# Patient Record
Sex: Male | Born: 2000
Health system: Southern US, Community
[De-identification: ages and names within clinical notes are randomized; demographics above are authoritative.]

## PROBLEM LIST (undated history)

## (undated) DIAGNOSIS — Z789 Other specified health status: Secondary | ICD-10-CM

## (undated) DIAGNOSIS — F988 Other specified behavioral and emotional disorders with onset usually occurring in childhood and adolescence: Secondary | ICD-10-CM

## (undated) HISTORY — PX: APPENDECTOMY: SHX54

---
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
## 2019-03-22 DIAGNOSIS — T2101XA Burn of unspecified degree of chest wall, initial encounter: Secondary | ICD-10-CM | POA: Diagnosis not present

## 2019-03-22 DIAGNOSIS — L089 Local infection of the skin and subcutaneous tissue, unspecified: Secondary | ICD-10-CM | POA: Diagnosis not present

## 2019-03-23 DIAGNOSIS — Z79899 Other long term (current) drug therapy: Secondary | ICD-10-CM | POA: Diagnosis not present

## 2019-03-23 DIAGNOSIS — F909 Attention-deficit hyperactivity disorder, unspecified type: Secondary | ICD-10-CM | POA: Diagnosis not present

## 2019-04-08 ENCOUNTER — Other Ambulatory Visit: Payer: Self-pay

## 2019-04-08 ENCOUNTER — Ambulatory Visit
Admission: EM | Admit: 2019-04-08 | Discharge: 2019-04-08 | Disposition: A | Discharge status: Home / Self Care | Payer: 59 | Attending: Family Medicine | Admitting: Family Medicine

## 2019-04-08 DIAGNOSIS — S0181XA Laceration without foreign body of other part of head, initial encounter: Secondary | ICD-10-CM | POA: Diagnosis not present

## 2019-04-08 DIAGNOSIS — S0591XA Unspecified injury of right eye and orbit, initial encounter: Secondary | ICD-10-CM

## 2019-04-08 MED ORDER — MELOXICAM 15 MG PO TABS: 15.0000 mg | ORAL_TABLET | Freq: Every day | ORAL | 0 refills | Status: DC | PRN

## 2019-04-08 MED ORDER — MUPIROCIN 2 % EX OINT
1.0000 | TOPICAL_OINTMENT | Freq: Two times a day (BID) | CUTANEOUS | 0 refills | Status: AC
Start: 2019-04-08 — End: 2019-04-15

## 2019-04-08 NOTE — Discharge Instructions (Signed)
Sutures out in 5 days.  Medications as prescribed.  Take care  Dr. Lacinda Axon
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
## 2019-04-19 ENCOUNTER — Telehealth: Payer: 59 | Admitting: Family

## 2019-04-19 ENCOUNTER — Encounter (INDEPENDENT_AMBULATORY_CARE_PROVIDER_SITE_OTHER): Payer: Self-pay

## 2019-04-19 ENCOUNTER — Telehealth: Payer: Self-pay | Admitting: *Deleted

## 2019-04-19 ENCOUNTER — Other Ambulatory Visit: Payer: 59

## 2019-04-19 ENCOUNTER — Telehealth: Payer: Self-pay

## 2019-04-19 DIAGNOSIS — R6889 Other general symptoms and signs: Secondary | ICD-10-CM | POA: Diagnosis not present

## 2019-04-19 DIAGNOSIS — R059 Cough, unspecified: Secondary | ICD-10-CM

## 2019-04-19 DIAGNOSIS — Z20822 Contact with and (suspected) exposure to covid-19: Secondary | ICD-10-CM

## 2019-04-19 DIAGNOSIS — R05 Cough: Secondary | ICD-10-CM

## 2019-04-19 MED ORDER — BENZONATATE 100 MG PO CAPS: 100.0000 mg | ORAL_CAPSULE | Freq: Three times a day (TID) | ORAL | 0 refills | Status: DC | PRN

## 2019-04-19 MED ORDER — ALBUTEROL SULFATE HFA 108 (90 BASE) MCG/ACT IN AERS: 2.0000 | INHALATION_SPRAY | RESPIRATORY_TRACT | 2 refills | Status: DC | PRN

## 2019-04-19 NOTE — Progress Notes (Signed)
Greater than 5 minutes, yet less than 10 minutes of time have been spent researching, coordinating, and implementing care for this patient today.  Thank you for the details you included in the comment boxes. Those details are very helpful in determining the best course of treatment for you and help us to provide the best care.  E-Visit for Corona Virus Screening   Your current symptoms could be consistent with the coronavirus.  Call your health care provider or local health department to request and arrange formal testing. Many health care providers can now test patients at their office but not all are.  Please quarantine yourself while awaiting your test results.  [facility] [phone], [facility] [phone], [facility] [phone] or visit [email or web address].ncdhhs.gov/about-covid-19/testing/covid-[email or web address]  and You have been enrolled in MyChart Home Monitoring for COVID-19.  Daily you will receive a questionnaire within the MyChart website. Our COVID-19 response team will be monitoring your responses daily.    COVID-19 is a respiratory illness with symptoms that are similar to the flu. Symptoms are typically mild to moderate, but there have been cases of severe illness and death due to the virus. The following symptoms may appear 2-14 days after exposure: . Fever . Cough . Shortness of breath or difficulty breathing . Chills . Repeated shaking with chills . Muscle pain . Headache . Sore throat . New loss of taste or smell . Fatigue . Congestion or runny nose . Nausea or vomiting . Diarrhea  It is vitally important that if you feel that you have an infection such as this virus or any other virus that you stay home and away from places where you may spread it to others.  You should self-quarantine for 14 days if you have symptoms that could potentially be coronavirus or have been in close contact a with a  person diagnosed with COVID-19 within the last 2 weeks. You should avoid contact with people age 65 and older.   You should wear a mask or cloth face covering over your nose and mouth if you must be around other people or animals, including pets (even at home). Try to stay at least 6 feet away from other people. This will protect the people around you.  You can use medication such as A prescription cough medication called Tessalon Perles 100 mg. You may take 1-2 capsules every 8 hours as needed for cough and A prescription inhaler called Albuterol MDI 90 mcg /actuation 2 puffs every 4 hours as needed for shortness of breath, wheezing, cough  You may also take acetaminophen (Tylenol) as needed for fever.   Reduce your risk of any infection by using the same precautions used for avoiding the common cold or flu:  . Wash your hands often with soap and warm water for at least 20 seconds.  If soap and water are not readily available, use an alcohol-based hand sanitizer with at least 60% alcohol.  . If coughing or sneezing, cover your mouth and nose by coughing or sneezing into the elbow areas of your shirt or coat, into a tissue or into your sleeve (not your hands). . Avoid shaking hands with others and consider head nods or verbal greetings only. . Avoid touching your eyes, nose, or mouth with unwashed hands.  . Avoid close contact with people who are sick. . Avoid places or events with large numbers of people in one location, like concerts or sporting events. . Carefully consider travel plans you have or   are making. . If you are planning any travel outside or inside the [place], visit the CDC's Travelers' Health webpage for the latest health notices. . If you have some symptoms but not all symptoms, continue to monitor at home and seek medical attention if your symptoms worsen. . If you are having a medical emergency, call 911.  HOME CARE . Only take medications as instructed by your medical  team. . Drink plenty of fluids and get plenty of rest. . A steam or ultrasonic humidifier can help if you have congestion.   GET HELP RIGHT AWAY IF YOU HAVE EMERGENCY WARNING SIGNS** FOR COVID-19. If you or someone is showing any of these signs seek emergency medical care immediately. Call 911 or proceed to your closest emergency facility if: . You develop worsening high fever. . Trouble breathing . Bluish lips or face . Persistent pain or pressure in the chest . New confusion . Inability to wake or stay awake . You cough up blood. . Your symptoms become more severe  **This list is not all possible symptoms. Contact your medical provider for any symptoms that are sever or concerning to you.   MAKE SURE YOU   Understand these instructions.  Will watch your condition.  Will get help right away if you are not doing well or get worse.  Your e-visit answers were reviewed by a board certified advanced clinical practitioner to complete your personal care plan.  Depending on the condition, your plan could have included both over the counter or prescription medications.  If there is a problem please reply once you have received a response from your provider.  Your safety is important to us.  If you have drug allergies check your prescription carefully.    You can use MyChart to ask questions about today's visit, request a non-urgent call back, or ask for a work or school excuse for 24 hours related to this e-Visit. If it has been greater than 24 hours you will need to follow up with your provider, or enter a new e-Visit to address those concerns. You will get an e-mail in the next two days asking about your experience.  I hope that your e-visit has been valuable and will speed your recovery. Thank you for using e-visits.    

## 2019-04-19 NOTE — Telephone Encounter (Signed)
[facility] Calling to request testing for patient Phone: [phone] Fax: [phone]  Exposure at work  Ebony [phone] (mother) She is going to have patient call back to schedule his appointment- orders have been placed.

## 2019-04-19 NOTE — Telephone Encounter (Signed)
Pt returned call for ordered testing by [facility]. Appointment scheduled. Order placed

## 2019-04-21 LAB — NOVEL CORONAVIRUS, NAA: SARS-CoV-2, NAA: NOT DETECTED

## 2019-04-23 ENCOUNTER — Encounter (INDEPENDENT_AMBULATORY_CARE_PROVIDER_SITE_OTHER): Payer: Self-pay

## 2019-05-07 ENCOUNTER — Ambulatory Visit (INDEPENDENT_AMBULATORY_CARE_PROVIDER_SITE_OTHER): Payer: 59

## 2019-05-07 ENCOUNTER — Encounter (HOSPITAL_COMMUNITY): Payer: Self-pay

## 2019-05-07 ENCOUNTER — Ambulatory Visit (HOSPITAL_COMMUNITY)
Admission: EM | Admit: 2019-05-07 | Discharge: 2019-05-07 | Disposition: A | Discharge status: Home / Self Care | Payer: 59 | Attending: Family Medicine | Admitting: Family Medicine

## 2019-05-07 DIAGNOSIS — M79641 Pain in right hand: Secondary | ICD-10-CM | POA: Diagnosis not present

## 2019-05-07 DIAGNOSIS — S6991XA Unspecified injury of right wrist, hand and finger(s), initial encounter: Secondary | ICD-10-CM | POA: Diagnosis not present

## 2019-05-07 NOTE — Discharge Instructions (Addendum)
No fractures on the x ray Ice the area a few times a day.  Ibuprofen as needed for pain.  Follow up as needed for continued or worsening symptoms

## 2019-05-07 NOTE — ED Provider Notes (Signed)
[place]    CSN: [id number] Arrival date & time: 05/07/19  1130     History   Chief Complaint Chief Complaint  Patient presents with  . Hand Injury    HPI Anthony Gordon is a 18 y.o. male.   Patient is a 18 year old male that presents today with right hand injury.  This occurred 3 days ago.  He injured his hand after punching his truck.  There has been swelling to the head of the fourth and fifth metacarpals.  Some pain with movement.  He has not iced injury.  He has taken ibuprofen.  The pain comes and goes when he moves his hand a certain way.  No numbness, tingling or color changes.  ROS per HPI      Past Medical History:  Diagnosis Date  . Attention deficit disorder   . No pertinent past medical history     There are no active problems to display for this patient.   Past Surgical History:  Procedure Laterality Date  . APPENDECTOMY    . LAPAROSCOPIC APPENDECTOMY  07/11/2012   Procedure: APPENDECTOMY LAPAROSCOPIC;  Surgeon: Jerilynn Mages. Gerald Stabs, MD;  Location: [place];  Service: Pediatrics;  Laterality: N/A;       Home Medications    Prior to Admission medications   Medication Sig Start Date End Date Taking? Authorizing Provider  albuterol (VENTOLIN HFA) 108 (90 Base) MCG/ACT inhaler Inhale 2 puffs into the lungs every 4 (four) hours as needed for wheezing or shortness of breath. 04/19/19   Withrow, Elyse Jarvis, FNP  benzonatate (TESSALON PERLES) 100 MG capsule Take 1-2 capsules (100-200 mg total) by mouth every 8 (eight) hours as needed for cough. 04/19/19   Withrow, Elyse Jarvis, FNP  meloxicam (MOBIC) 15 MG tablet Take 1 tablet (15 mg total) by mouth daily as needed for pain. 04/08/19   Coral Spikes, DO  Methylphenidate HCl (CONCERTA PO) Take by mouth.    [provider]    Family History Family History  Problem Relation Age of Onset  . Diabetes Sister     Social History Social History   Tobacco Use  . Smoking status: Current Some Day Smoker     Types: Cigarettes  . Smokeless tobacco: Never Used  Substance Use Topics  . Alcohol use: Yes    Comment: occasionally  . Drug use: No     Allergies   Patient has no known allergies.   Review of Systems Review of Systems   Physical Exam Triage Vital Signs ED Triage Vitals [05/07/19 1216]  Enc Vitals Group     BP 136/80     Pulse Rate 80     Resp 18     Temp 98.2 F (36.8 C)     Temp Source Oral     SpO2 100 %     Weight      Height      Head Circumference      Peak Flow      Pain Score 2     Pain Loc      Pain Edu?      Excl. in [place]?    No data found.  Updated Vital Signs BP 136/80 (BP Location: Right Arm)   Pulse 80   Temp 98.2 F (36.8 C) (Oral)   Resp 18   SpO2 100%   Visual Acuity Right Eye Distance:   Left Eye Distance:   Bilateral Distance:    Right Eye Near:   Left  Eye Near:    Bilateral Near:     Physical Exam Vitals signs and nursing note reviewed.  Constitutional:      Appearance: Normal appearance.  HENT:     Head: Normocephalic and atraumatic.     Nose: Nose normal.  Eyes:     Conjunctiva/sclera: Conjunctivae normal.  Neck:     Musculoskeletal: Normal range of motion.  Pulmonary:     Effort: Pulmonary effort is normal.  Musculoskeletal:        General: Swelling, tenderness and signs of injury present.     Comments: Swelling and mild tenderness to the head of the 4th and 5th metacarpals.  Able to wiggle fingers. Radial pulse strong with good cap refill. No obvious deformity or laceration.  Skin:    General: Skin is warm and dry.     Capillary Refill: Capillary refill takes less than 2 seconds.  Neurological:     Mental Status: He is alert.  Psychiatric:        Mood and Affect: Mood normal.      UC Treatments / Results  Labs (all labs ordered are listed, but only abnormal results are displayed) Labs Reviewed - No data to display  EKG   Radiology Dg Hand Complete Right  Result Date: 05/07/2019 CLINICAL DATA:   Right hand pain for 2 days.  No known injury. EXAM: RIGHT HAND - COMPLETE 3+ VIEW COMPARISON:  None. FINDINGS: There is no evidence of fracture or dislocation. There is no evidence of arthropathy or other focal bone abnormality. Soft tissues are unremarkable. IMPRESSION: Normal exam. Electronically Signed   By: Inge Rise M.D.   On: 05/07/2019 13:17    Procedures Procedures (including critical care time)  Medications Ordered in UC Medications - No data to display  Initial Impression / Assessment and Plan / UC Course  I have reviewed the triage vital signs and the nursing notes.  Pertinent labs & imaging results that were available during my care of the patient were reviewed by me and considered in my medical decision making (see chart for details).     No acute fractures noted on x-ray. Will have patient rest, ice and take ibuprofen as needed Follow up as needed for continued or worsening symptoms  Final Clinical Impressions(s) / UC Diagnoses   Final diagnoses:  Injury of right hand, initial encounter     Discharge Instructions     No fractures on the x ray Ice the area a few times a day.  Ibuprofen as needed for pain.  Follow up as needed for continued or worsening symptoms    ED Prescriptions    None     Controlled Substance Prescriptions [place] Controlled Substance Registry consulted? Not Applicable   Orvan July, NP 05/07/19 1322

## 2019-05-07 NOTE — ED Triage Notes (Signed)
Pt C/O right hand injury. Pt states he punch his truck and possible broke his right hand.

## 2019-05-09 DIAGNOSIS — F4321 Adjustment disorder with depressed mood: Secondary | ICD-10-CM | POA: Diagnosis not present

## 2019-07-09 ENCOUNTER — Other Ambulatory Visit: Payer: Self-pay

## 2019-07-09 ENCOUNTER — Encounter (HOSPITAL_COMMUNITY): Payer: Self-pay | Admitting: Emergency Medicine

## 2019-07-09 ENCOUNTER — Emergency Department (HOSPITAL_COMMUNITY)
Admission: EM | Admit: 2019-07-09 | Discharge: 2019-07-10 | Disposition: A | Discharge status: Other Acute Inpatient Hospital | Payer: 59 | Attending: Emergency Medicine | Admitting: Emergency Medicine

## 2019-07-09 DIAGNOSIS — R5381 Other malaise: Secondary | ICD-10-CM | POA: Diagnosis not present

## 2019-07-09 DIAGNOSIS — Y906 Blood alcohol level of 120-199 mg/100 ml: Secondary | ICD-10-CM | POA: Diagnosis not present

## 2019-07-09 DIAGNOSIS — Z79899 Other long term (current) drug therapy: Secondary | ICD-10-CM | POA: Insufficient documentation

## 2019-07-09 DIAGNOSIS — E860 Dehydration: Secondary | ICD-10-CM | POA: Insufficient documentation

## 2019-07-09 DIAGNOSIS — R4689 Other symptoms and signs involving appearance and behavior: Secondary | ICD-10-CM | POA: Diagnosis not present

## 2019-07-09 DIAGNOSIS — Z20828 Contact with and (suspected) exposure to other viral communicable diseases: Secondary | ICD-10-CM | POA: Insufficient documentation

## 2019-07-09 DIAGNOSIS — F10120 Alcohol abuse with intoxication, uncomplicated: Secondary | ICD-10-CM | POA: Diagnosis not present

## 2019-07-09 DIAGNOSIS — R52 Pain, unspecified: Secondary | ICD-10-CM | POA: Diagnosis not present

## 2019-07-09 DIAGNOSIS — F10920 Alcohol use, unspecified with intoxication, uncomplicated: Secondary | ICD-10-CM | POA: Diagnosis not present

## 2019-07-09 DIAGNOSIS — Z720 Tobacco use: Secondary | ICD-10-CM | POA: Insufficient documentation

## 2019-07-09 DIAGNOSIS — F909 Attention-deficit hyperactivity disorder, unspecified type: Secondary | ICD-10-CM | POA: Insufficient documentation

## 2019-07-09 DIAGNOSIS — Z046 Encounter for general psychiatric examination, requested by authority: Secondary | ICD-10-CM | POA: Diagnosis present

## 2019-07-09 DIAGNOSIS — R456 Violent behavior: Secondary | ICD-10-CM | POA: Diagnosis not present

## 2019-07-09 DIAGNOSIS — Z03818 Encounter for observation for suspected exposure to other biological agents ruled out: Secondary | ICD-10-CM | POA: Diagnosis not present

## 2019-07-09 LAB — CBC
HCT: 43.2 % (ref 39.0–52.0)
Hemoglobin: 14.3 g/dL (ref 13.0–17.0)
MCH: 27.9 pg (ref 26.0–34.0)
MCHC: 33.1 g/dL (ref 30.0–36.0)
MCV: 84.2 fL (ref 80.0–100.0)
Platelets: 342 10*3/uL (ref 150–400)
RBC: 5.13 MIL/uL (ref 4.22–5.81)
RDW: 13.8 % (ref 11.5–15.5)
WBC: 18.7 10*3/uL — ABNORMAL HIGH (ref 4.0–10.5)
nRBC: 0 % (ref 0.0–0.2)

## 2019-07-09 LAB — COMPREHENSIVE METABOLIC PANEL
ALT: 24 U/L (ref 0–44)
AST: 29 U/L (ref 15–41)
Albumin: 3.8 g/dL (ref 3.5–5.0)
Alkaline Phosphatase: 86 U/L (ref 38–126)
Anion gap: 9 (ref 5–15)
BUN: 9 mg/dL (ref 6–20)
CO2: 23 mmol/L (ref 22–32)
Calcium: 9 mg/dL (ref 8.9–10.3)
Chloride: 105 mmol/L (ref 98–111)
Creatinine, Ser: 1.48 mg/dL — ABNORMAL HIGH (ref 0.61–1.24)
GFR calc Af Amer: >60 mL/min (ref >60)
GFR calc non Af Amer: >60 mL/min (ref >60)
Glucose, Bld: 102 mg/dL — ABNORMAL HIGH (ref 70–99)
Potassium: 3.9 mmol/L (ref 3.5–5.1)
Sodium: 137 mmol/L (ref 135–145)
Total Bilirubin: 0.5 mg/dL (ref 0.3–1.2)
Total Protein: 6.9 g/dL (ref 6.5–8.1)

## 2019-07-09 LAB — ETHANOL: Alcohol, Ethyl (B): 178 mg/dL — ABNORMAL HIGH (ref <10)

## 2019-07-09 MED ORDER — METHYLPHENIDATE HCL ER (OSM) 18 MG PO TBCR: 72.0000 mg | EXTENDED_RELEASE_TABLET | Freq: Every day | ORAL | Status: DC

## 2019-07-09 MED ORDER — ACETAMINOPHEN 325 MG PO TABS
650.0000 mg | ORAL_TABLET | ORAL | Status: DC | PRN
  Filled 2019-07-09: qty 2
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
## 2019-07-10 ENCOUNTER — Observation Stay (HOSPITAL_COMMUNITY)
Admission: AD | Admit: 2019-07-10 | Discharge: 2019-07-10 | Disposition: A | Discharge status: Home / Self Care | Payer: 59 | Attending: Psychiatry | Admitting: Psychiatry

## 2019-07-10 ENCOUNTER — Ambulatory Visit (HOSPITAL_COMMUNITY): Payer: Self-pay

## 2019-07-10 ENCOUNTER — Other Ambulatory Visit: Payer: Self-pay

## 2019-07-10 ENCOUNTER — Encounter (HOSPITAL_COMMUNITY): Payer: Self-pay | Admitting: Emergency Medicine

## 2019-07-10 DIAGNOSIS — Z79899 Other long term (current) drug therapy: Secondary | ICD-10-CM | POA: Diagnosis not present

## 2019-07-10 DIAGNOSIS — R4689 Other symptoms and signs involving appearance and behavior: Secondary | ICD-10-CM | POA: Diagnosis not present

## 2019-07-10 DIAGNOSIS — F909 Attention-deficit hyperactivity disorder, unspecified type: Secondary | ICD-10-CM | POA: Diagnosis not present

## 2019-07-10 DIAGNOSIS — F1099 Alcohol use, unspecified with unspecified alcohol-induced disorder: Secondary | ICD-10-CM | POA: Diagnosis not present

## 2019-07-10 DIAGNOSIS — Y906 Blood alcohol level of 120-199 mg/100 ml: Secondary | ICD-10-CM | POA: Diagnosis not present

## 2019-07-10 LAB — SARS CORONAVIRUS 2 BY RT PCR (HOSPITAL ORDER, PERFORMED IN ~~LOC~~ HOSPITAL LAB): SARS Coronavirus 2: NEGATIVE

## 2019-07-10 LAB — RAPID URINE DRUG SCREEN, HOSP PERFORMED
Amphetamines: NOT DETECTED
Barbiturates: NOT DETECTED
Benzodiazepines: NOT DETECTED
Cocaine: NOT DETECTED
Opiates: NOT DETECTED
Tetrahydrocannabinol: NOT DETECTED

## 2019-07-10 MED ORDER — MAGNESIUM HYDROXIDE 400 MG/5ML PO SUSP: 30.0000 mL | Freq: Every day | ORAL | Status: DC | PRN

## 2019-07-10 MED ORDER — CHLORDIAZEPOXIDE HCL 25 MG PO CAPS: 25.0000 mg | ORAL_CAPSULE | Freq: Four times a day (QID) | ORAL | Status: DC | PRN

## 2019-07-10 MED ORDER — ACETAMINOPHEN 325 MG PO TABS: 650.0000 mg | ORAL_TABLET | Freq: Four times a day (QID) | ORAL | Status: DC | PRN

## 2019-07-10 MED ORDER — ALUM & MAG HYDROXIDE-SIMETH 200-200-20 MG/5ML PO SUSP: 30.0000 mL | ORAL | Status: DC | PRN

## 2019-07-10 MED ORDER — ACETAMINOPHEN 325 MG PO TABS: 650.0000 mg | ORAL_TABLET | ORAL | Status: DC | PRN

## 2019-07-10 MED ORDER — ONDANSETRON 4 MG PO TBDP: 4.0000 mg | ORAL_TABLET | Freq: Four times a day (QID) | ORAL | Status: DC | PRN

## 2019-07-10 MED ORDER — METHYLPHENIDATE HCL ER (OSM) 36 MG PO TBCR: 72.0000 mg | EXTENDED_RELEASE_TABLET | Freq: Every day | ORAL | Status: DC

## 2019-07-10 MED ORDER — HYDROXYZINE HCL 25 MG PO TABS: 25.0000 mg | ORAL_TABLET | Freq: Three times a day (TID) | ORAL | Status: DC | PRN

## 2019-07-10 MED ORDER — ALBUTEROL SULFATE HFA 108 (90 BASE) MCG/ACT IN AERS: 2.0000 | INHALATION_SPRAY | RESPIRATORY_TRACT | Status: DC | PRN

## 2019-07-10 NOTE — BHH Counselor (Signed)
Clinician called Threasa Beards, RN to express in addition to the pt's UDS and resulted COVID test, pt's completed IVC paperwork is needed. Once all is received clinician will coordinate to have pt admitted to OBS Unit.    Vertell Novak, MS, [facility], [facility] Triage Specialist [phone]. Fax: [phone].

## 2019-07-10 NOTE — Plan of Care (Signed)
[facility] Observation Crisis Plan  Reason for Crisis Plan:  Crisis Stabilization   Plan of Care:  Referral for Inpatient Hospitalization  Family Support:    yes  Current Living Environment:   Lives with Parents  Insurance:   Hospital Account    Name Acct ID Class Status Primary Coverage   Raymen, Tondre [id number] [place] [employer] [place] [employer]        Guarantor Account (for Hospital Account [id number])    Name Relation to Pt Service Area Active? Acct Type   Pennie Rushing Self [facility] Yes Behavioral Health   Address Phone       [address] [place], [place] [address] [phone])          Coverage Information (for Hospital Account [id number])    F/O Payor/Plan Precert #   [place] [employer]/[place] [employer]    Subscriber Subscriber #   [place], [place] [id number]   [place]   [address] [place] [address] [phone]      Legal Guardian:     Primary Care Provider:  Johny Drilling, MD  Current Outpatient Providers:  Buford Dresser, DO  Psychiatrist:     Counselor/Therapist:     Compliant with Medications:  No  Additional Information:   Jesusita Oka 9/8/20203:09 AM

## 2019-07-10 NOTE — Progress Notes (Signed)
Pt d/c from the OBS unit. All items returned. D/C instructions given. Pt denies si and hi.
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
## 2019-07-10 NOTE — ED Notes (Signed)
Pt being transferred to [facility] w/ [employer]. [place]ings, IVC paper work, Risk analyst to ride, Education officer, museum, and consent all in [employer] possession.

## 2019-07-10 NOTE — BH Assessment (Signed)
[facility] Assessment Progress Note  Per Buford Dresser, DO, this pt does not require psychiatric hospitalization at this time.  Pt presents under IVC initiated by pt's mother and upheld by EDP Varney Biles, MD, which Dr Mariea Clonts has rescinded.  Pt is to be discharged from the [facility] Observation Unit.  Pt would benefit from seeing Peer Support Specialists, who have since spoken to pt.  They report that pt minimizes the significance of the presenting problem, and that he has declined outpatient referrals for follow up.  Pt's nurse, Jan, has been notified.  Jalene Mullet, [place] Triage Specialist [phone]
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
## 2019-07-10 NOTE — Discharge Summary (Addendum)
Patient seen and case assessed by psychiatry team. Patient reports improvement of symptoms at this time. He seems to minimize his irritability when under the influence, however denies any legal charges, history of violence and or physical aggression. On the day of discharge, the patient was evaluated by the attending MD and was discussed with the multidisciplinary treatment team. The treatment team has determined the patient to be stable and appropriate for discharge with medical approval. Day of discharge assessment included a suicide and violence risk assessment. Risk factors for self-harm/suicide and violence that are present at time of discharge include: younger age, prior expressed suicidal ideation, low intellectual functioning. These risk factors are mitigated by the following factors:well known to mental health system, available outpatient providers, presence of follow-up plan. While future psychiatric events cannot be accurately predicted, the patient does not currently require further acute inpatient psychiatric care and does not currently meet [facility] involuntary commitment criteria. It is recommended that the patient continue treatment in outpatient care. A follow up plan and crisis plan are in place, have been discussed with the patient, and the patient agrees to the plan at time of discharge.Will discharge home at this time.  Patient seen face-to-face for psychiatric evaluation, chart reviewed and case discussed with the physician extender and developed treatment plan. Reviewed the information documented and agree with the treatment plan.  Buford Dresser, DO 07/10/19 6:17 PM

## 2019-07-10 NOTE — Patient Outreach (Signed)
[facility]PSS met with the patient on the observation unit in order to provide substance use recovery support and help with getting connected to substance use treatment resources. Patient reports drinking alcohol on the weekends and denies a history of active alcohol addiction. Patient states his alcohol use is manageable at this time, and the patient does not want help with getting connected to substance use treatment resources. CPSS still provided information for substance use recovery resources. Some of these resources include residential/outpatient substance use treatment center list, AA meeting list for [facility], information for [facility], information for the [facility], and [facility]PSS contact information. CPSS strongly encouraged the patient to contact CPSS if needed for further support or for any help with getting connected to substance use recovery resources.
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
## 2019-08-31 ENCOUNTER — Other Ambulatory Visit: Payer: Self-pay | Admitting: *Deleted

## 2019-08-31 DIAGNOSIS — Z20822 Contact with and (suspected) exposure to covid-19: Secondary | ICD-10-CM

## 2019-09-01 LAB — NOVEL CORONAVIRUS, NAA: SARS-CoV-2, NAA: NOT DETECTED

## 2019-09-02 ENCOUNTER — Emergency Department
Admission: EM | Admit: 2019-09-02 | Discharge: 2019-09-02 | Disposition: A | Discharge status: Home / Self Care | Payer: Managed Care, Other (non HMO) | Attending: Emergency Medicine | Admitting: Emergency Medicine

## 2019-09-02 ENCOUNTER — Emergency Department: Payer: Managed Care, Other (non HMO)

## 2019-09-02 DIAGNOSIS — Y929 Unspecified place or not applicable: Secondary | ICD-10-CM | POA: Diagnosis not present

## 2019-09-02 DIAGNOSIS — F17210 Nicotine dependence, cigarettes, uncomplicated: Secondary | ICD-10-CM | POA: Insufficient documentation

## 2019-09-02 DIAGNOSIS — Y939 Activity, unspecified: Secondary | ICD-10-CM | POA: Insufficient documentation

## 2019-09-02 DIAGNOSIS — Y999 Unspecified external cause status: Secondary | ICD-10-CM | POA: Diagnosis not present

## 2019-09-02 DIAGNOSIS — X013XXA Fall due to uncontrolled fire, not in building or structure, initial encounter: Secondary | ICD-10-CM | POA: Insufficient documentation

## 2019-09-02 DIAGNOSIS — F10920 Alcohol use, unspecified with intoxication, uncomplicated: Secondary | ICD-10-CM | POA: Diagnosis not present

## 2019-09-02 DIAGNOSIS — T300 Burn of unspecified body region, unspecified degree: Secondary | ICD-10-CM

## 2019-09-02 DIAGNOSIS — T23202A Burn of second degree of left hand, unspecified site, initial encounter: Secondary | ICD-10-CM | POA: Diagnosis not present

## 2019-09-02 DIAGNOSIS — T2124XA Burn of second degree of lower back, initial encounter: Secondary | ICD-10-CM | POA: Diagnosis not present

## 2019-09-02 DIAGNOSIS — T23232A Burn of second degree of multiple left fingers (nail), not including thumb, initial encounter: Secondary | ICD-10-CM | POA: Diagnosis not present

## 2019-09-02 DIAGNOSIS — Y907 Blood alcohol level of 200-239 mg/100 ml: Secondary | ICD-10-CM | POA: Insufficient documentation

## 2019-09-02 DIAGNOSIS — T2123XA Burn of second degree of upper back, initial encounter: Secondary | ICD-10-CM | POA: Diagnosis not present

## 2019-09-02 DIAGNOSIS — M7989 Other specified soft tissue disorders: Secondary | ICD-10-CM | POA: Diagnosis not present

## 2019-09-02 DIAGNOSIS — T23242A Burn of second degree of multiple left fingers (nail), including thumb, initial encounter: Secondary | ICD-10-CM | POA: Diagnosis not present

## 2019-09-02 LAB — CBC WITH DIFFERENTIAL/PLATELET
Abs Immature Granulocytes: 0.08 10*3/uL — ABNORMAL HIGH (ref 0.00–0.07)
Basophils Absolute: 0.1 10*3/uL (ref 0.0–0.1)
Basophils Relative: 1 %
Eosinophils Absolute: 0 10*3/uL (ref 0.0–0.5)
Eosinophils Relative: 0 %
HCT: 44.4 % (ref 39.0–52.0)
Hemoglobin: 15 g/dL (ref 13.0–17.0)
Immature Granulocytes: 1 %
Lymphocytes Relative: 16 %
Lymphs Abs: 1.9 10*3/uL (ref 0.7–4.0)
MCH: 28.9 pg (ref 26.0–34.0)
MCHC: 33.8 g/dL (ref 30.0–36.0)
MCV: 85.5 fL (ref 80.0–100.0)
Monocytes Absolute: 0.7 10*3/uL (ref 0.1–1.0)
Monocytes Relative: 6 %
Neutro Abs: 9.3 10*3/uL — ABNORMAL HIGH (ref 1.7–7.7)
Neutrophils Relative %: 76 %
Platelets: 325 10*3/uL (ref 150–400)
RBC: 5.19 MIL/uL (ref 4.22–5.81)
RDW: 13.2 % (ref 11.5–15.5)
WBC: 12.1 10*3/uL — ABNORMAL HIGH (ref 4.0–10.5)
nRBC: 0 % (ref 0.0–0.2)

## 2019-09-02 LAB — COMPREHENSIVE METABOLIC PANEL
ALT: 23 U/L (ref 0–44)
AST: 27 U/L (ref 15–41)
Albumin: 4.6 g/dL (ref 3.5–5.0)
Alkaline Phosphatase: 93 U/L (ref 38–126)
Anion gap: 13 (ref 5–15)
BUN: 12 mg/dL (ref 6–20)
CO2: 25 mmol/L (ref 22–32)
Calcium: 9.2 mg/dL (ref 8.9–10.3)
Chloride: 105 mmol/L (ref 98–111)
Creatinine, Ser: 1.08 mg/dL (ref 0.61–1.24)
GFR calc Af Amer: >60 mL/min (ref >60)
GFR calc non Af Amer: >60 mL/min (ref >60)
Glucose, Bld: 121 mg/dL — ABNORMAL HIGH (ref 70–99)
Potassium: 3.7 mmol/L (ref 3.5–5.1)
Sodium: 143 mmol/L (ref 135–145)
Total Bilirubin: 0.9 mg/dL (ref 0.3–1.2)
Total Protein: 8 g/dL (ref 6.5–8.1)

## 2019-09-02 LAB — ETHANOL: Alcohol, Ethyl (B): 231 mg/dL — ABNORMAL HIGH (ref <10)

## 2019-09-02 MED ORDER — IBUPROFEN 800 MG PO TABS: 800.0000 mg | ORAL_TABLET | Freq: Three times a day (TID) | ORAL | 0 refills | Status: DC | PRN

## 2019-09-02 MED ORDER — SODIUM CHLORIDE 0.9 % IV BOLUS
1000.0000 mL | Freq: Once | INTRAVENOUS | Status: AC
  Administered 2019-09-02: 02:00:00 1000 mL via INTRAVENOUS

## 2019-09-02 MED ORDER — KETOROLAC TROMETHAMINE 30 MG/ML IJ SOLN
INTRAMUSCULAR | Status: AC
  Administered 2019-09-02: 02:00:00
  Filled 2019-09-02: qty 1

## 2019-09-02 MED ORDER — TETANUS-DIPHTH-ACELL PERTUSSIS 5-2.5-18.5 LF-MCG/0.5 IM SUSP: 0.5000 mL | Freq: Once | INTRAMUSCULAR | Status: DC

## 2019-09-02 MED ORDER — SILVER SULFADIAZINE 1 % EX CREA: TOPICAL_CREAM | CUTANEOUS | 1 refills | Status: DC

## 2019-09-02 MED ORDER — SILVER SULFADIAZINE 1 % EX CREA
TOPICAL_CREAM | Freq: Once | CUTANEOUS | Status: AC
  Administered 2019-09-02: 1 via TOPICAL
  Filled 2019-09-02: qty 85

## 2019-09-02 MED ORDER — OXYCODONE-ACETAMINOPHEN 5-325 MG PO TABS: 1.0000 | ORAL_TABLET | ORAL | 0 refills | Status: DC | PRN
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
## 2019-09-02 NOTE — ED Notes (Signed)
Dr Beather Arbour in to wake pt and speak with him and mother;

## 2019-09-02 NOTE — ED Notes (Signed)
Mother out to desk stating she would like to wake pt and get him home if the xray of his hand is negative; pt is sleeping soundly; Dr Beather Arbour aware and will go in to speak with pt and mom asap; mom verbalized understanding;

## 2019-09-02 NOTE — ED Notes (Signed)
Mom to bedside.

## 2019-09-02 NOTE — ED Notes (Signed)
Pharmacy called for silvadene cream.
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
## 2019-09-02 NOTE — ED Notes (Signed)
Toradol and iv fluids given .

## 2019-09-03 ENCOUNTER — Telehealth: Payer: Self-pay | Admitting: General Practice

## 2019-09-03 NOTE — Telephone Encounter (Signed)
Negative COVID results given. Patient results "NOT Detected." Caller expressed understanding. ° °

## 2019-11-27 ENCOUNTER — Ambulatory Visit: Payer: Managed Care, Other (non HMO) | Attending: Internal Medicine

## 2019-11-27 DIAGNOSIS — Z20822 Contact with and (suspected) exposure to covid-19: Secondary | ICD-10-CM

## 2019-11-28 LAB — NOVEL CORONAVIRUS, NAA: SARS-CoV-2, NAA: NOT DETECTED

## 2020-05-20 ENCOUNTER — Emergency Department
Admission: EM | Admit: 2020-05-20 | Discharge: 2020-05-21 | Disposition: A | Discharge status: Home / Self Care | Payer: Worker's Compensation | Attending: Emergency Medicine | Admitting: Emergency Medicine

## 2020-05-20 ENCOUNTER — Other Ambulatory Visit: Payer: Self-pay

## 2020-05-20 DIAGNOSIS — F17210 Nicotine dependence, cigarettes, uncomplicated: Secondary | ICD-10-CM | POA: Insufficient documentation

## 2020-05-20 DIAGNOSIS — T754XXA Electrocution, initial encounter: Secondary | ICD-10-CM | POA: Insufficient documentation

## 2020-05-20 LAB — CBC
HCT: 45.8 % (ref 39.0–52.0)
Hemoglobin: 16.1 g/dL (ref 13.0–17.0)
MCH: 30.2 pg (ref 26.0–34.0)
MCHC: 35.2 g/dL (ref 30.0–36.0)
MCV: 85.9 fL (ref 80.0–100.0)
Platelets: 308 10*3/uL (ref 150–400)
RBC: 5.33 MIL/uL (ref 4.22–5.81)
RDW: 12.4 % (ref 11.5–15.5)
WBC: 10.3 10*3/uL (ref 4.0–10.5)
nRBC: 0 % (ref 0.0–0.2)

## 2020-05-20 LAB — BASIC METABOLIC PANEL
Anion gap: 9 (ref 5–15)
BUN: 12 mg/dL (ref 6–20)
CO2: 28 mmol/L (ref 22–32)
Calcium: 9.6 mg/dL (ref 8.9–10.3)
Chloride: 101 mmol/L (ref 98–111)
Creatinine, Ser: 1.45 mg/dL — ABNORMAL HIGH (ref 0.61–1.24)
GFR calc Af Amer: >60 mL/min (ref >60)
GFR calc non Af Amer: >60 mL/min (ref >60)
Glucose, Bld: 102 mg/dL — ABNORMAL HIGH (ref 70–99)
Potassium: 4 mmol/L (ref 3.5–5.1)
Sodium: 138 mmol/L (ref 135–145)

## 2020-05-20 LAB — TROPONIN I (HIGH SENSITIVITY): Troponin I (High Sensitivity): 3 ng/L (ref <18)

## 2020-05-20 LAB — CK: Total CK: 317 U/L (ref 49–397)
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
## 2020-08-19 ENCOUNTER — Other Ambulatory Visit: Payer: Self-pay

## 2020-08-19 ENCOUNTER — Ambulatory Visit
Admission: EM | Admit: 2020-08-19 | Discharge: 2020-08-19 | Disposition: A | Discharge status: Home / Self Care | Payer: 59 | Attending: Emergency Medicine | Admitting: Emergency Medicine

## 2020-08-19 ENCOUNTER — Other Ambulatory Visit: Payer: Self-pay | Admitting: Emergency Medicine

## 2020-08-19 ENCOUNTER — Encounter: Payer: Self-pay | Admitting: Emergency Medicine

## 2020-08-19 DIAGNOSIS — R21 Rash and other nonspecific skin eruption: Secondary | ICD-10-CM

## 2020-08-19 MED ORDER — CLOBETASOL PROPIONATE 0.05 % EX LOTN: 1.0000 "application " | TOPICAL_LOTION | Freq: Two times a day (BID) | CUTANEOUS | 0 refills | Status: DC

## 2020-08-19 MED ORDER — MUPIROCIN CALCIUM 2 % NA OINT: TOPICAL_OINTMENT | NASAL | 0 refills | Status: AC
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
## 2020-08-19 NOTE — Discharge Instructions (Addendum)
The rash on the back of your left thigh could be either an early soft tissue skin infection, cellulitis, or it could be a fungal infection due to the moist environment to work in.  Apply the mupirocin to take care of any bacterial infection 3 times a day for 7 days.  Apply the clobetasol lotion twice a day to the area to treat any fungus present and help with itching.  Keep the area open to the air and dry.  While at work and wearing pants cover the area with some gauze loosely to keep moisture away from the skin.  Follow-up with your primary care provider if there is no improvement.

## 2020-08-19 NOTE — ED Triage Notes (Signed)
Patient c/o rash to left upper thigh area x 1 week. He reports the area is red and itchy.

## 2020-08-20 ENCOUNTER — Encounter (HOSPITAL_COMMUNITY): Payer: Self-pay

## 2020-08-20 ENCOUNTER — Ambulatory Visit (HOSPITAL_COMMUNITY)
Admission: EM | Admit: 2020-08-20 | Discharge: 2020-08-20 | Disposition: A | Discharge status: Home / Self Care | Payer: 59 | Attending: Family Medicine | Admitting: Family Medicine

## 2020-08-20 ENCOUNTER — Other Ambulatory Visit: Payer: Self-pay

## 2020-08-20 DIAGNOSIS — L259 Unspecified contact dermatitis, unspecified cause: Secondary | ICD-10-CM

## 2020-08-20 DIAGNOSIS — L03116 Cellulitis of left lower limb: Secondary | ICD-10-CM

## 2020-08-20 MED ORDER — DOXYCYCLINE HYCLATE 100 MG PO CAPS: 100.0000 mg | ORAL_CAPSULE | Freq: Two times a day (BID) | ORAL | 0 refills | Status: DC

## 2020-08-20 MED ORDER — PREDNISONE 20 MG PO TABS: 40.0000 mg | ORAL_TABLET | Freq: Every day | ORAL | 0 refills | Status: AC

## 2020-08-20 NOTE — ED Triage Notes (Signed)
Pt present left leg rash, the area is red and itching. He noticed this a week ago.

## 2020-08-23 NOTE — ED Provider Notes (Signed)
[name]   [id number] 08/20/20 Arrival Time: 1919  ASSESSMENT & PLAN:  1. Contact dermatitis, unspecified contact dermatitis type, unspecified trigger   2. Cellulitis of left lower extremity    Will cover for cellulitis. Work note provided.  Meds ordered this encounter  Medications  . doxycycline (VIBRAMYCIN) 100 MG capsule    Sig: Take 1 capsule (100 mg total) by mouth 2 (two) times daily.    Dispense:  14 capsule    Refill:  0  . predniSONE (DELTASONE) 20 MG tablet    Sig: Take 2 tablets (40 mg total) by mouth daily.    Dispense:  10 tablet    Refill:  0    Will follow up with PCP or here if worsening or failing to improve as anticipated. Reviewed expectations re: course of current medical issues. Questions answered. Outlined signs and symptoms indicating need for more acute intervention. Patient verbalized understanding. After Visit Summary given.   SUBJECTIVE:  Anthony Gordon is a 19 y.o. male who presents with a skin complaint. Posterior left lower leg; area of redness and itching a few days. Now feels redness is spreading and hot. Afebrile. Ambulatory without difficulty. No extremity sensation changes or weakness.  No OTC tx.   OBJECTIVE: Vitals:   08/20/20 2018  BP: 139/78  Pulse: 92  Resp: 18  Temp: 98.9 F (37.2 C)  TempSrc: Oral  SpO2: 99%    General appearance: alert; no distress HEENT: [place]; AT Neck: supple with FROM Extremities: no edema; moves all extremities normally Skin: warm and dry; approx 10x10 cm area of erythema over posterior upper left calf; hot to touch; lighter erythema extending from borders; no areas of fluctuance; knee with FROM Psychological: alert and cooperative; normal mood and affect  No Known Allergies  Past Medical History:  Diagnosis Date  . Attention deficit disorder   . No pertinent past medical history    Social History   Socioeconomic History  . Marital status: Single    Spouse name: Not on file    . Number of children: Not on file  . Years of education: Not on file  . Highest education level: Not on file  Occupational History  . Not on file  Tobacco Use  . Smoking status: Current Some Day Smoker    Types: Cigarettes  . Smokeless tobacco: Never Used  Vaping Use  . Vaping Use: Every day  Substance and Sexual Activity  . Alcohol use: Yes    Comment: occasionally  . Drug use: No  . Sexual activity: Never  Other Topics Concern  . Not on file  Social History Narrative  . Not on file   Social Determinants of Health   Financial Resource Strain:   . Difficulty of Paying Living Expenses: Not on file  Food Insecurity:   . Worried About Charity fundraiser in the Last Year: Not on file  . Ran Out of Food in the Last Year: Not on file  Transportation Needs:   . Lack of Transportation (Medical): Not on file  . Lack of Transportation (Non-Medical): Not on file  Physical Activity:   . Days of Exercise per Week: Not on file  . Minutes of Exercise per Session: Not on file  Stress:   . Feeling of Stress : Not on file  Social Connections:   . Frequency of Communication with Friends and Family: Not on file  . Frequency of Social Gatherings with Friends and Family: Not on file  .  Attends Religious Services: Not on file  . Active Member of Clubs or Organizations: Not on file  . Attends Archivist Meetings: Not on file  . Marital Status: Not on file  Intimate Partner Violence:   . Fear of Current or Ex-Partner: Not on file  . Emotionally Abused: Not on file  . Physically Abused: Not on file  . Sexually Abused: Not on file   Family History  Problem Relation Age of Onset  . Diabetes Sister    Past Surgical History:  Procedure Laterality Date  . APPENDECTOMY    . LAPAROSCOPIC APPENDECTOMY  07/11/2012   Procedure: APPENDECTOMY LAPAROSCOPIC;  Surgeon: Jerilynn Mages. Gerald Stabs, MD;  Location: [place];  Service: Pediatrics;  Laterality: N/AVanessa Kick, MD 08/23/20  [phone]

## 2020-08-29 ENCOUNTER — Other Ambulatory Visit: Payer: Self-pay | Admitting: Internal Medicine

## 2020-08-29 DIAGNOSIS — R12 Heartburn: Secondary | ICD-10-CM | POA: Diagnosis not present

## 2020-08-29 DIAGNOSIS — L259 Unspecified contact dermatitis, unspecified cause: Secondary | ICD-10-CM | POA: Diagnosis not present

## 2020-09-29 DIAGNOSIS — Z1152 Encounter for screening for COVID-19: Secondary | ICD-10-CM | POA: Diagnosis not present

## 2020-09-29 DIAGNOSIS — Z03818 Encounter for observation for suspected exposure to other biological agents ruled out: Secondary | ICD-10-CM | POA: Diagnosis not present

## 2020-12-07 ENCOUNTER — Other Ambulatory Visit: Payer: Self-pay

## 2020-12-07 DIAGNOSIS — L989 Disorder of the skin and subcutaneous tissue, unspecified: Secondary | ICD-10-CM | POA: Diagnosis not present

## 2021-01-10 ENCOUNTER — Encounter (HOSPITAL_COMMUNITY): Payer: Self-pay

## 2021-01-10 ENCOUNTER — Emergency Department (HOSPITAL_COMMUNITY): Payer: 59

## 2021-01-10 ENCOUNTER — Other Ambulatory Visit: Payer: Self-pay

## 2021-01-10 ENCOUNTER — Emergency Department (HOSPITAL_COMMUNITY)
Admission: EM | Admit: 2021-01-10 | Discharge: 2021-01-10 | Disposition: A | Discharge status: Home / Self Care | Payer: 59 | Attending: Emergency Medicine | Admitting: Emergency Medicine

## 2021-01-10 DIAGNOSIS — S60221A Contusion of right hand, initial encounter: Secondary | ICD-10-CM | POA: Insufficient documentation

## 2021-01-10 DIAGNOSIS — W2209XA Striking against other stationary object, initial encounter: Secondary | ICD-10-CM | POA: Insufficient documentation

## 2021-01-10 DIAGNOSIS — F17210 Nicotine dependence, cigarettes, uncomplicated: Secondary | ICD-10-CM | POA: Insufficient documentation

## 2021-01-10 DIAGNOSIS — S6991XA Unspecified injury of right wrist, hand and finger(s), initial encounter: Secondary | ICD-10-CM | POA: Diagnosis not present

## 2021-01-10 NOTE — ED Notes (Signed)
Patient verbalized understanding discharge. Patient in hallway bed and unable to sign escribe.

## 2021-01-10 NOTE — Discharge Instructions (Signed)
Your x-ray today did not show any broken bones in your right hand.  Return with worsening swelling, pain, redness, pus draining from any injuries.  Use ice and elevate the hand when possible.  Take over-the-counter Tylenol or Motrin as directed on the packaging for pain.
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
## 2021-01-13 DIAGNOSIS — Z0000 Encounter for general adult medical examination without abnormal findings: Secondary | ICD-10-CM | POA: Diagnosis not present

## 2021-01-13 DIAGNOSIS — R7989 Other specified abnormal findings of blood chemistry: Secondary | ICD-10-CM | POA: Diagnosis not present

## 2021-01-20 DIAGNOSIS — R7989 Other specified abnormal findings of blood chemistry: Secondary | ICD-10-CM | POA: Diagnosis not present

## 2021-01-20 DIAGNOSIS — R82998 Other abnormal findings in urine: Secondary | ICD-10-CM | POA: Diagnosis not present

## 2021-01-20 DIAGNOSIS — R12 Heartburn: Secondary | ICD-10-CM | POA: Diagnosis not present

## 2021-01-20 DIAGNOSIS — Z0000 Encounter for general adult medical examination without abnormal findings: Secondary | ICD-10-CM | POA: Diagnosis not present

## 2021-01-20 DIAGNOSIS — F17200 Nicotine dependence, unspecified, uncomplicated: Secondary | ICD-10-CM | POA: Diagnosis not present

## 2021-03-18 ENCOUNTER — Emergency Department: Payer: 59

## 2021-03-18 ENCOUNTER — Other Ambulatory Visit: Payer: Self-pay

## 2021-03-18 ENCOUNTER — Emergency Department
Admission: EM | Admit: 2021-03-18 | Discharge: 2021-03-18 | Disposition: A | Discharge status: Home / Self Care | Payer: 59 | Attending: Emergency Medicine | Admitting: Emergency Medicine

## 2021-03-18 DIAGNOSIS — S60512A Abrasion of left hand, initial encounter: Secondary | ICD-10-CM | POA: Insufficient documentation

## 2021-03-18 DIAGNOSIS — M7989 Other specified soft tissue disorders: Secondary | ICD-10-CM | POA: Diagnosis not present

## 2021-03-18 DIAGNOSIS — M79642 Pain in left hand: Secondary | ICD-10-CM | POA: Diagnosis not present

## 2021-03-18 DIAGNOSIS — S60511A Abrasion of right hand, initial encounter: Secondary | ICD-10-CM | POA: Insufficient documentation

## 2021-03-18 DIAGNOSIS — F17210 Nicotine dependence, cigarettes, uncomplicated: Secondary | ICD-10-CM | POA: Insufficient documentation

## 2021-03-18 DIAGNOSIS — M79641 Pain in right hand: Secondary | ICD-10-CM | POA: Diagnosis not present

## 2021-03-18 DIAGNOSIS — S6991XA Unspecified injury of right wrist, hand and finger(s), initial encounter: Secondary | ICD-10-CM | POA: Diagnosis present

## 2021-03-18 MED ORDER — TETANUS-DIPHTH-ACELL PERTUSSIS 5-2.5-18.5 LF-MCG/0.5 IM SUSY
0.5000 mL | PREFILLED_SYRINGE | Freq: Once | INTRAMUSCULAR | Status: DC
  Filled 2021-03-18: qty 0.5

## 2021-03-18 NOTE — Discharge Instructions (Addendum)
Patient is medically cleared for incarceration with appropriate monitoring and follow-up for wound care
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
## 2021-03-18 NOTE — ED Notes (Signed)
Abrasions to bilateral hands, left wrist and right elbow cleaned with NS solution and 4x4 gauze. Pt tolerated well. No remaining shards of glass noted. NAD. Officer remains at bedside.

## 2021-03-26 DIAGNOSIS — S63501A Unspecified sprain of right wrist, initial encounter: Secondary | ICD-10-CM | POA: Diagnosis not present

## 2022-02-18 DIAGNOSIS — R7989 Other specified abnormal findings of blood chemistry: Secondary | ICD-10-CM | POA: Diagnosis not present

## 2022-02-22 ENCOUNTER — Other Ambulatory Visit: Payer: Self-pay | Admitting: Internal Medicine

## 2022-02-22 DIAGNOSIS — R7989 Other specified abnormal findings of blood chemistry: Secondary | ICD-10-CM

## 2022-11-08 DIAGNOSIS — M67431 Ganglion, right wrist: Secondary | ICD-10-CM | POA: Diagnosis not present

## 2022-12-07 DIAGNOSIS — M67431 Ganglion, right wrist: Secondary | ICD-10-CM | POA: Diagnosis not present

## 2023-04-23 ENCOUNTER — Emergency Department: Payer: 59

## 2023-04-23 ENCOUNTER — Emergency Department
Admission: EM | Admit: 2023-04-23 | Discharge: 2023-04-24 | Disposition: A | Discharge status: Home / Self Care | Payer: 59 | Attending: Emergency Medicine | Admitting: Emergency Medicine

## 2023-04-23 DIAGNOSIS — S62291B Other fracture of first metacarpal bone, right hand, initial encounter for open fracture: Secondary | ICD-10-CM

## 2023-04-23 DIAGNOSIS — R791 Abnormal coagulation profile: Secondary | ICD-10-CM | POA: Diagnosis not present

## 2023-04-23 DIAGNOSIS — Y9389 Activity, other specified: Secondary | ICD-10-CM | POA: Diagnosis not present

## 2023-04-23 DIAGNOSIS — S61411A Laceration without foreign body of right hand, initial encounter: Secondary | ICD-10-CM | POA: Diagnosis not present

## 2023-04-23 DIAGNOSIS — S52201A Unspecified fracture of shaft of right ulna, initial encounter for closed fracture: Secondary | ICD-10-CM | POA: Diagnosis not present

## 2023-04-23 DIAGNOSIS — S62234A Other nondisplaced fracture of base of first metacarpal bone, right hand, initial encounter for closed fracture: Secondary | ICD-10-CM | POA: Insufficient documentation

## 2023-04-23 DIAGNOSIS — W260XXA Contact with knife, initial encounter: Secondary | ICD-10-CM | POA: Diagnosis not present

## 2023-04-23 DIAGNOSIS — S62201A Unspecified fracture of first metacarpal bone, right hand, initial encounter for closed fracture: Secondary | ICD-10-CM | POA: Diagnosis not present

## 2023-04-23 DIAGNOSIS — S6991XA Unspecified injury of right wrist, hand and finger(s), initial encounter: Secondary | ICD-10-CM | POA: Diagnosis not present

## 2023-04-23 DIAGNOSIS — R000 Tachycardia, unspecified: Secondary | ICD-10-CM | POA: Diagnosis not present

## 2023-04-23 LAB — CBC WITH DIFFERENTIAL/PLATELET
Abs Immature Granulocytes: 0.09 10*3/uL — ABNORMAL HIGH (ref 0.00–0.07)
Basophils Absolute: 0.2 10*3/uL — ABNORMAL HIGH (ref 0.0–0.1)
Basophils Relative: 1 %
Eosinophils Absolute: 0.2 10*3/uL (ref 0.0–0.5)
Eosinophils Relative: 2 %
HCT: 49.3 % (ref 39.0–52.0)
Hemoglobin: 17.2 g/dL — ABNORMAL HIGH (ref 13.0–17.0)
Immature Granulocytes: 1 %
Lymphocytes Relative: 43 %
Lymphs Abs: 4.5 10*3/uL — ABNORMAL HIGH (ref 0.7–4.0)
MCH: 32.3 pg (ref 26.0–34.0)
MCHC: 34.9 g/dL (ref 30.0–36.0)
MCV: 92.5 fL (ref 80.0–100.0)
Monocytes Absolute: 0.8 10*3/uL (ref 0.1–1.0)
Monocytes Relative: 8 %
Neutro Abs: 4.6 10*3/uL (ref 1.7–7.7)
Neutrophils Relative %: 45 %
Platelets: 343 10*3/uL (ref 150–400)
RBC: 5.33 MIL/uL (ref 4.22–5.81)
RDW: 13.2 % (ref 11.5–15.5)
WBC: 10.4 10*3/uL (ref 4.0–10.5)
nRBC: 0 % (ref 0.0–0.2)

## 2023-04-23 LAB — COMPREHENSIVE METABOLIC PANEL
ALT: 249 U/L — ABNORMAL HIGH (ref 0–44)
AST: 237 U/L — ABNORMAL HIGH (ref 15–41)
Albumin: 4.8 g/dL (ref 3.5–5.0)
Alkaline Phosphatase: 92 U/L (ref 38–126)
Anion gap: 18 — ABNORMAL HIGH (ref 5–15)
BUN: 7 mg/dL (ref 6–20)
CO2: 17 mmol/L — ABNORMAL LOW (ref 22–32)
Calcium: 10.4 mg/dL — ABNORMAL HIGH (ref 8.9–10.3)
Chloride: 96 mmol/L — ABNORMAL LOW (ref 98–111)
Creatinine, Ser: 1.38 mg/dL — ABNORMAL HIGH (ref 0.61–1.24)
GFR, Estimated: >60 mL/min (ref >60)
Glucose, Bld: 136 mg/dL — ABNORMAL HIGH (ref 70–99)
Potassium: 3 mmol/L — ABNORMAL LOW (ref 3.5–5.1)
Sodium: 131 mmol/L — ABNORMAL LOW (ref 135–145)
Total Bilirubin: 0.9 mg/dL (ref 0.3–1.2)
Total Protein: 8.7 g/dL — ABNORMAL HIGH (ref 6.5–8.1)

## 2023-04-23 LAB — APTT: aPTT: 29 seconds (ref 24–36)

## 2023-04-23 LAB — TYPE AND SCREEN
ABO/RH(D): O POS
Antibody Screen: NEGATIVE

## 2023-04-23 LAB — PROTIME-INR
INR: 1.1 (ref 0.8–1.2)
Prothrombin Time: 13.9 seconds (ref 11.4–15.2)

## 2023-04-23 MED ORDER — ONDANSETRON HCL 4 MG/2ML IJ SOLN
INTRAMUSCULAR | Status: AC
  Filled 2023-04-23: qty 2

## 2023-04-23 MED ORDER — ONDANSETRON HCL 4 MG/2ML IJ SOLN
4.0000 mg | Freq: Once | INTRAMUSCULAR | Status: AC
  Administered 2023-04-23: 4 mg via INTRAVENOUS

## 2023-04-23 MED ORDER — SODIUM CHLORIDE 0.9 % IV SOLN
2.0000 g | Freq: Once | INTRAVENOUS | Status: AC
  Administered 2023-04-24: 2 g via INTRAVENOUS
  Filled 2023-04-23: qty 20

## 2023-04-23 MED ORDER — LIDOCAINE-EPINEPHRINE 2 %-1:100000 IJ SOLN
20.0000 mL | Freq: Once | INTRAMUSCULAR | Status: AC
  Administered 2023-04-23: 20 mL via INTRADERMAL

## 2023-04-23 NOTE — ED Notes (Signed)
Called [facility] for possible transfer per Dr. Erma Heritage, hand surgery
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
## 2023-04-23 NOTE — ED Notes (Signed)
MD was in to assess thumb laceration of R hand. Thumb was almost cut through cleanly. A tourniquet was placed on the pt, pt was medicated per MD and laceration cart was in the room. After the suturing, tourniquet was removed and there was no further bleeding.  Wound was cleansed again and a non-stick dressing was placed on. Per parents, the pt is UTD on his tetanus. This RN explained if that if they weren't sure, they should check with the pt's PCP ASAP and a tetanus shot can be provided for the pt. The mother states an interest in going to [facility] for the hand surgeon that the pt has had with in the past with another orthopedic issue Famliy at bedside at this time

## 2023-04-24 ENCOUNTER — Other Ambulatory Visit: Payer: Self-pay

## 2023-04-24 MED ORDER — CEPHALEXIN 500 MG PO CAPS
500.0000 mg | ORAL_CAPSULE | Freq: Four times a day (QID) | ORAL | 0 refills | Status: DC
  Filled 2023-04-24: qty 28, 7d supply, fill #0

## 2023-04-24 MED ORDER — OXYCODONE-ACETAMINOPHEN 5-325 MG PO TABS
1.0000 | ORAL_TABLET | Freq: Four times a day (QID) | ORAL | 0 refills | Status: DC | PRN
  Filled 2023-04-24: qty 15, 2d supply, fill #0

## 2023-04-24 MED ORDER — ONDANSETRON 4 MG PO TBDP: 4.0000 mg | ORAL_TABLET | Freq: Three times a day (TID) | ORAL | 0 refills | Status: AC | PRN

## 2023-04-24 MED ORDER — OXYCODONE-ACETAMINOPHEN 5-325 MG PO TABS: 1.0000 | ORAL_TABLET | Freq: Four times a day (QID) | ORAL | 0 refills | Status: AC | PRN

## 2023-04-24 MED ORDER — OXYCODONE-ACETAMINOPHEN 5-325 MG PO TABS: 1.0000 | ORAL_TABLET | Freq: Four times a day (QID) | ORAL | 0 refills | Status: DC | PRN

## 2023-04-24 MED ORDER — OXYCODONE-ACETAMINOPHEN 5-325 MG PO TABS: 1.0000 | ORAL_TABLET | ORAL | 0 refills | Status: DC | PRN

## 2023-04-24 MED ORDER — TETANUS-DIPHTH-ACELL PERTUSSIS 5-2.5-18.5 LF-MCG/0.5 IM SUSY: 0.5000 mL | PREFILLED_SYRINGE | Freq: Once | INTRAMUSCULAR | Status: DC

## 2023-04-24 MED ORDER — CEPHALEXIN 500 MG PO CAPS: 500.0000 mg | ORAL_CAPSULE | Freq: Four times a day (QID) | ORAL | 0 refills | Status: AC

## 2023-04-24 MED ORDER — SULFAMETHOXAZOLE-TRIMETHOPRIM 800-160 MG PO TABS: 1.0000 | ORAL_TABLET | Freq: Two times a day (BID) | ORAL | 0 refills | Status: AC

## 2023-04-24 MED ORDER — SULFAMETHOXAZOLE-TRIMETHOPRIM 800-160 MG PO TABS
1.0000 | ORAL_TABLET | Freq: Two times a day (BID) | ORAL | 0 refills | Status: DC
  Filled 2023-04-24: qty 14, 7d supply, fill #0

## 2023-04-24 MED ORDER — ONDANSETRON 4 MG PO TBDP
4.0000 mg | ORAL_TABLET | Freq: Three times a day (TID) | ORAL | 0 refills | Status: DC | PRN
  Filled 2023-04-24: qty 20, 7d supply, fill #0

## 2023-04-24 NOTE — Discharge Instructions (Signed)
Keep the hand elevated at all times  Take the pain meds as prescribed  Take the antibiotics  Follow-up with HAND SURGERY on MONDAY or TUESDAY AT THE LATEST  Call their office to let them know you were seen in the Er. Case was discussed with Dr. Hyacinth Meeker tonight, and Dr. Janee Morn at [facility].

## 2023-04-24 NOTE — ED Notes (Signed)
Pt and family refused the tdap.  Sling and spica placed on pt per order.

## 2023-04-25 DIAGNOSIS — S61411A Laceration without foreign body of right hand, initial encounter: Secondary | ICD-10-CM | POA: Diagnosis not present

## 2023-04-25 DIAGNOSIS — M79641 Pain in right hand: Secondary | ICD-10-CM | POA: Diagnosis not present

## 2023-04-26 DIAGNOSIS — S61411A Laceration without foreign body of right hand, initial encounter: Secondary | ICD-10-CM | POA: Diagnosis not present

## 2023-04-26 DIAGNOSIS — Y999 Unspecified external cause status: Secondary | ICD-10-CM | POA: Diagnosis not present

## 2023-04-26 DIAGNOSIS — X58XXXA Exposure to other specified factors, initial encounter: Secondary | ICD-10-CM | POA: Diagnosis not present

## 2023-04-26 DIAGNOSIS — S62231B Other displaced fracture of base of first metacarpal bone, right hand, initial encounter for open fracture: Secondary | ICD-10-CM | POA: Diagnosis not present

## 2023-04-26 DIAGNOSIS — S6431XA Injury of digital nerve of right thumb, initial encounter: Secondary | ICD-10-CM | POA: Diagnosis not present

## 2023-04-26 DIAGNOSIS — S66021A Laceration of long flexor muscle, fascia and tendon of right thumb at wrist and hand level, initial encounter: Secondary | ICD-10-CM | POA: Diagnosis not present

## 2023-05-12 DIAGNOSIS — Z4789 Encounter for other orthopedic aftercare: Secondary | ICD-10-CM | POA: Diagnosis not present

## 2023-05-12 DIAGNOSIS — M25641 Stiffness of right hand, not elsewhere classified: Secondary | ICD-10-CM | POA: Diagnosis not present

## 2023-05-24 DIAGNOSIS — M25641 Stiffness of right hand, not elsewhere classified: Secondary | ICD-10-CM | POA: Diagnosis not present

## 2023-06-02 DIAGNOSIS — M25641 Stiffness of right hand, not elsewhere classified: Secondary | ICD-10-CM | POA: Diagnosis not present
